# Patient Record
Sex: Male | Born: 1960 | Race: White | Hispanic: No | Marital: Married
Health system: Midwestern US, Academic
[De-identification: ages and names within clinical notes are randomized; demographics above are authoritative.]

## PROBLEM LIST (undated history)

## (undated) DIAGNOSIS — K4090 Unilateral inguinal hernia, without obstruction or gangrene, not specified as recurrent: Secondary | ICD-10-CM

---

## 2016-12-07 ENCOUNTER — Ambulatory Visit: Admit: 2016-12-07 | Discharge: 2016-12-07 | Payer: PRIVATE HEALTH INSURANCE

## 2016-12-07 DIAGNOSIS — R29898 Other symptoms and signs involving the musculoskeletal system: Secondary | ICD-10-CM

## 2016-12-07 NOTE — Unmapped (Signed)
[facility]  Department of Neurology and Rehabilitation  [address] [place], [address]  [place], [place] [address]  [phone]   Fax [phone]  Diplomate, American Board of Neuromuscular and Electrodiagnostic Medicine  Diplomate, American Board of Physical Medicine and Rehabilitation    Test Date:  12/07/2016    Patient: Craig Hughes DOB: Jul 08, 1961 Physician: Recardo Evangelist. Arabel Barcenas, MD   Sex: Male Height: '  Ref Phys: Bradly Chris, DO   ID#: [id number] Weight:  lbs. Technician: Recardo Evangelist. Ercia Crisafulli, MD     Patient Complaints:  Craig Hughes is seen at the request of Dr. Lou Miner for weakness and stiffness in all 4 extremities. He denies any neck pain or difficulty with gait. He noticed that his thighs were weak 1 1/2 years ago when trying to bring his feet up while boarding an ultralight.   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  No reports of numbness, paresthesias, or electrical sensation in the extremities.  No falls, or difficulties rising from chair or floor.  No bowel or bladder changes.      Patient History / Exam:  Normal upper and lower extremity muscle bulk and tone; no signs of atrophy or fasciculations. Sensation is normal in the bilateral upper and lower extremities. Normal strength at the shoulder and hip girdles.    Findings:   Monopolar needle EMG was performed in selected left upper and left lower extremity muscles innervated C5-T1 and L2-S1 nerve roots inclusive.  No spontaneous activity was seen in any muscles tested in the form of fibrillations, positive sharp waves, or fasciculations.  Voluntary motor unit morphologies are otherwise normal.    Motor and sensory nerve conduction studies revealed normal distal latencies, amplitudes and conduction velocities for median and ulnar nerves on the left.    Motor nerve and conduction studies revealed normal distal latencies and conduction velocities of tibial and peroneal nerves.  Sensory nerve  conduction studies revealed normal peak latencies and amplitudes for the superficial peroneal and sural nerves.          IMPRESSIONS:   1.  There are no significant electrodiagnostic findings suggesting the presence of a focal or generalized neuropathy affecting the left upper extremity and the left lower extremity.  There is no electrodiagnostic evidence of generalized peripheral neuropathy or myopathic findings.      ___________________________  Recardo Evangelist. Gracelin Weisberg, MD        Nerve Conduction Studies  Anti Sensory Summary Table     Site NR Peak (ms) Norm Peak (ms) P-T Amp (??V) Norm P-T Amp Site1 Site2 Delta-P (ms) Dist (cm) Vel (m/s) Norm Vel (m/s)   Left Radial Anti Sensory (Base 1st Digit)  30.7??C   Wrist    2.2 <3.1 28.2  Wrist Base 1st Digit 2.2 0.0     Left Sup Peron Anti Sensory (Ant Lat Mall)  31.1??C   14 cm    3.3 <4.4 11.8 >5.0 14 cm Ant Lat Mall 3.3 14.0 42 >32   Left Sural Anti Sensory (Lat Mall)  31??C   Calf    2.9 <4.0 28.2 >5.0 Calf Lat Mall 2.9 14.0 48 >35     Ortho Sensory Summary Table     Site NR Peak (ms) Norm Peak (ms) P-T Amp (??V) Norm P-T Amp Site1 Site2 Delta-P (ms) Dist (cm) Vel (m/s) Norm Vel (m/s)   Left Palmar Ortho Sensory (Wrist)  31??C   Median Palm    1.8 2.4  36.6  Median Palm Wrist 1.8 8.0 44    Ulnar Palm    1.5 2.4 40.7  Ulnar Palm Wrist 1.5 8.0 53          Median Palm Ulnar Palm 0.3 0.0       Motor Summary Table     Site NR Onset (ms) Norm Onset (ms) O-P Amp (mV) Norm O-P Amp Site1 Site2 Delta-0 (ms) Dist (cm) Vel (m/s) Norm Vel (m/s)   Left Median Motor (Abd Poll Brev)  30.4??C   Wrist    3.7 <4.2 8.3 >5 Elbow Wrist 4.0 24.0 60 >50   Elbow    7.7  8.4          Left Peroneal Motor (Ext Dig Brev)  30.8??C   Ankle    5.2 <6.1 2.0 >2.5 B Fib Ankle 5.3 32.0 60 >38   B Fib    10.5  2.2  Poplt B Fib 2.0 10.0 50 >40   Poplt    12.5  2.2          Left Tibial Motor (Abd Hall Brev)  31.1??C   Ankle    3.9 <6.1 10.6 >3.0 Knee Ankle 8.2 41.0 50 >38   Knee    12.1  10.2              5.4  1.5           Left Ulnar Motor (Abd Dig Minimi)  30.8??C   Wrist    2.9 <4.2 9.7 >3 B Elbow Wrist 3.6 25.0 69 >49   B Elbow    6.5  9.1  A Elbow B Elbow 1.5 10.0 67 >49   A Elbow    8.0  10.5            EMG     Side Muscle Nerve Root Ins Act Fibs Psw Amp Dur Poly Recrt Int Dennie Bible Comment   Left Deltoid Axillary C5-6 Nml Nml Nml Nml Nml 0 Nml Nml    Left Biceps Musculocut C5-6 Nml Nml Nml Nml Nml 0 Nml Nml    Left Triceps Radial C6-7-8 Nml Nml Nml Nml Nml 0 Nml Nml    Left PronatorTeres Median C6-7 Nml Nml Nml Nml Nml 0 Nml Nml    Left BrachioRad Radial C5-6 Nml Nml Nml Nml Nml 0 Nml Nml    Left AntTibialis Dp Br Peron L4-5 Nml Nml Nml Nml Nml 0 Nml Nml    Left Peroneus Long Sup Br Peron L5-S1 Nml Nml Nml Nml Nml 0 Nml Nml    Left MedGastroc Tibial S1-2 Nml Nml Nml Nml Nml 0 Nml Nml    Left VastusMed Femoral L2-4 Nml Nml Nml Nml Nml 0 Nml Nml    Left RectFemoris Femoral L2-4 Nml Nml Nml Nml Nml 0 Nml Nml    Left Iliopsoas Femoral L2-3 Nml Nml Nml Nml Nml 0 Nml Nml        Nerve Conduction Studies  Anti Sensory Left/Right Comparison     Site L Lat (ms) R Lat (ms) L-R Lat (ms) L Amp (??V) R Amp (??V) L-R Amp (%) Site1 Site2 L Vel (m/s) R Vel (m/s) L-R Vel (m/s)   Radial Anti Sensory (Base 1st Digit)  30.7??C   Wrist 2.2   28.2   Wrist Base 1st Digit      Sup Peron Anti Sensory (Ant Lat Mall)  31.1??C   14 cm 3.3   11.8   14 cm Ant Lat Mall 42  Sural Anti Sensory (Lat Mall)  31??C   Calf 2.9   28.2   Calf Lat Mall 48       Ortho Sensory Left/Right Comparison     Site L Lat (ms) R Lat (ms) L-R Lat (ms) L Amp (??V) R Amp (??V) L-R Amp (%) Site1 Site2 L Vel (m/s) R Vel (m/s) L-R Vel (m/s)   Palmar Ortho Sensory (Wrist)  31??C   Median Palm 1.8   36.6   Median Palm Wrist 44     Ulnar Palm 1.5   40.7   Ulnar Palm Wrist 53            Median Palm Ulnar Palm        Motor Left/Right Comparison     Site L Lat (ms) R Lat (ms) L-R Lat (ms) L Amp (mV) R Amp (mV) L-R Amp (%) Site1 Site2 L Vel (m/s) R Vel (m/s) L-R Vel (m/s)   Median Motor (Abd Poll  Brev)  30.4??C   Wrist 3.7   8.3   Elbow Wrist 60     Elbow 7.7   8.4          Peroneal Motor (Ext Dig Brev)  30.8??C   Ankle 5.2   2.0   B Fib Ankle 60     B Fib 10.5   2.2   Poplt B Fib 50     Poplt 12.5   2.2          Tibial Motor (Abd Hall Brev)  31.1??C   Ankle 3.9   10.6   Knee Ankle 50     Knee 12.1   10.2           5.4   1.5          Ulnar Motor (Abd Dig Minimi)  30.8??C   Wrist 2.9   9.7   B Elbow Wrist 69     B Elbow 6.5   9.1   A Elbow B Elbow 67     A Elbow 8.0   10.5                Waveforms:

## 2018-02-28 ENCOUNTER — Emergency Department (HOSPITAL_COMMUNITY)
Admission: EM | Admit: 2018-02-28 | Discharge: 2018-03-01 | Disposition: A | Discharge status: Home / Self Care | Payer: PRIVATE HEALTH INSURANCE | Attending: Emergency Medicine | Admitting: Emergency Medicine

## 2018-02-28 ENCOUNTER — Other Ambulatory Visit: Payer: Self-pay

## 2018-02-28 ENCOUNTER — Encounter (HOSPITAL_COMMUNITY): Payer: Self-pay | Admitting: Emergency Medicine

## 2018-02-28 DIAGNOSIS — R109 Unspecified abdominal pain: Secondary | ICD-10-CM | POA: Diagnosis present

## 2018-02-28 DIAGNOSIS — N201 Calculus of ureter: Secondary | ICD-10-CM | POA: Diagnosis not present

## 2018-02-28 HISTORY — DX: Unilateral inguinal hernia, without obstruction or gangrene, not specified as recurrent: K40.90

## 2018-02-28 LAB — COMPREHENSIVE METABOLIC PANEL
ALK PHOS: 35 U/L — AB (ref 38–126)
ALT: 24 U/L (ref 0–44)
ANION GAP: 14 (ref 5–15)
AST: 29 U/L (ref 15–41)
Albumin: 4.6 g/dL (ref 3.5–5.0)
BUN: 28 mg/dL — ABNORMAL HIGH (ref 6–20)
CALCIUM: 9.5 mg/dL (ref 8.9–10.3)
CO2: 20 mmol/L — AB (ref 22–32)
Chloride: 104 mmol/L (ref 98–111)
Creatinine, Ser: 1.47 mg/dL — ABNORMAL HIGH (ref 0.61–1.24)
GFR calc non Af Amer: 50 mL/min — ABNORMAL LOW (ref >60)
GFR, EST AFRICAN AMERICAN: 59 mL/min — AB (ref >60)
Glucose, Bld: 162 mg/dL — ABNORMAL HIGH (ref 70–99)
Potassium: 3.7 mmol/L (ref 3.5–5.1)
SODIUM: 138 mmol/L (ref 135–145)
Total Bilirubin: 1 mg/dL (ref 0.3–1.2)
Total Protein: 7.2 g/dL (ref 6.5–8.1)

## 2018-02-28 LAB — CBC
HCT: 46 % (ref 39.0–52.0)
HEMOGLOBIN: 16 g/dL (ref 13.0–17.0)
MCH: 33.5 pg (ref 26.0–34.0)
MCHC: 34.8 g/dL (ref 30.0–36.0)
MCV: 96.2 fL (ref 78.0–100.0)
Platelets: 249 10*3/uL (ref 150–400)
RBC: 4.78 MIL/uL (ref 4.22–5.81)
RDW: 11.9 % (ref 11.5–15.5)
WBC: 12.8 10*3/uL — ABNORMAL HIGH (ref 4.0–10.5)

## 2018-02-28 LAB — URINALYSIS, ROUTINE W REFLEX MICROSCOPIC
Bilirubin Urine: NEGATIVE
Glucose, UA: NEGATIVE mg/dL
KETONES UR: 80 mg/dL — AB
Leukocytes, UA: NEGATIVE
NITRITE: NEGATIVE
PH: 7 (ref 5.0–8.0)
Protein, ur: 100 mg/dL — AB
Specific Gravity, Urine: 1.024 (ref 1.005–1.030)

## 2018-02-28 LAB — LIPASE, BLOOD: LIPASE: 32 U/L (ref 11–51)

## 2018-02-28 MED ORDER — ONDANSETRON 4 MG PO TBDP
4.0000 mg | ORAL_TABLET | Freq: Once | ORAL | Status: AC | PRN
  Administered 2018-02-28: 4 mg via ORAL
  Filled 2018-02-28: qty 1

## 2018-02-28 MED ORDER — OXYCODONE-ACETAMINOPHEN 5-325 MG PO TABS
1.0000 | ORAL_TABLET | Freq: Once | ORAL | Status: AC
  Administered 2018-02-28: 1 via ORAL
  Filled 2018-02-28: qty 1

## 2018-02-28 NOTE — ED Triage Notes (Signed)
Pt reports that earlier today pt was sitting at work and started having right flank pain, then reports that the pain traveled to his pelvic region, and has since not been able to urinate more than a few drops since 5pm. Pt ambulatory and vomiting in triage.

## 2018-02-28 NOTE — ED Provider Notes (Signed)
Patient placed in Quick Look pathway, seen and evaluated   Chief Complaint: Right flank and lower quadrant pain.  HPI:   Patient states his pain began acutely at 6:00 tonight.  He had severe right flank pain.  Since then, it has traveled down his right lower quadrant.  He states he thought he was constipated and needed to urinate, was unable to have a bowel movement or pass any urine.  He denies history of similar.  He denies history of kidney stones.  He denies fevers or chills.  He has vomited and still feels nauseous.  He noted some mild dysuria last night, but no other urinary symptoms including hematuria or urinary frequency.  He states that when getting a urine sample in the ER, he had to strain and this caused worsen pain.  ROS: R flank and RLQ pain  Physical Exam:   Gen: No distress  Neuro: Awake and Alert  Skin: Warm    Focused Exam: Tenderness palpation of right flank and right lower quadrant.   Initiation of care has begun. The patient has been counseled on the process, plan, and necessity for staying for the completion/evaluation, and the remainder of the medical screening examination    Alveria ApleyCaccavale, Kaytelyn Glore, PA-C 02/28/18 2223    Linwood DibblesKnapp, Jon, MD 02/28/18 2311

## 2018-03-01 ENCOUNTER — Emergency Department (HOSPITAL_COMMUNITY): Payer: PRIVATE HEALTH INSURANCE

## 2018-03-01 MED ORDER — KETOROLAC TROMETHAMINE 30 MG/ML IJ SOLN
30.0000 mg | Freq: Once | INTRAMUSCULAR | Status: AC
  Administered 2018-03-01: 30 mg via INTRAMUSCULAR
  Filled 2018-03-01: qty 1

## 2018-03-01 NOTE — Discharge Instructions (Addendum)
CT ABDOMEN AND PELVIS WITHOUT CONTRAST     TECHNIQUE:  Multidetector CT imaging of the abdomen and pelvis was performed  following the standard protocol without IV contrast.     COMPARISON:  None.     FINDINGS:  Lower chest: No acute abnormality.     Hepatobiliary: No focal liver abnormality is seen. No gallstones,  gallbladder wall thickening, or biliary dilatation.     Pancreas: Unremarkable. No pancreatic ductal dilatation or  surrounding inflammatory changes.     Spleen: Normal in size without focal abnormality.     Adrenals/Urinary Tract: Normal adrenal glands, left kidney, left  ureter, and bladder. Punctate right kidney upper pole nonobstructing  stone. Mild right hydronephrosis with 3 mm stone at the  ureterovesicular junction.     Stomach/Bowel: Stomach is within normal limits. Appendix appears  normal. No evidence of bowel wall thickening, distention, or  inflammatory changes.     Vascular/Lymphatic: Aortic atherosclerosis. No enlarged abdominal or  pelvic lymph nodes.     Reproductive: Small prostatic calcifications.     Other: No abdominal wall hernia or abnormality. No abdominopelvic  ascites.     Musculoskeletal: Mild lumbar levocurvature with apex at L1-2. no  acute osseous abnormality identified.     IMPRESSION:  Mild right hydronephrosis with 3 mm stone at the right  ureterovesicular junction. Punctate right kidney nonobstructing  stone.

## 2018-03-01 NOTE — ED Notes (Signed)
MD to bedside for exam.

## 2018-03-01 NOTE — ED Provider Notes (Signed)
[facility] EMERGENCY DEPARTMENT Provider Note  CSN: [id number] Arrival date & time: 02/28/18 2050  Chief Complaint(s) Flank Pain and Emesis  HPI Willie Burnett is a 57 y.o. male with no pertinent past medical history presents to the emergency department with 2 days of right flank pain that was gradually worsening since onset and became severe last night around 6 PM.  Pain radiates to the right groin.  Described as aching/cramping.  Fluctuating in nature.  Associated with mild dysuria and change in urine color.  No recent fevers or chills.  Endorses nausea and nonbloody nonbilious emesis due to the pain.  No diarrhea.  He denies any prior history of renal stones.  HPI  Past Medical History Past Medical History:  Diagnosis Date  . IH (inguinal hernia)    There are no active problems to display for this patient.  Home Medication(s) Prior to Admission medications   Not on File                                                                                                                                    Past Surgical History History reviewed. No pertinent surgical history. Family History No family history on file.  Social History Social History   Tobacco Use  . Smoking status: Never Smoker  . Smokeless tobacco: Never Used  Substance Use Topics  . Alcohol use: Not Currently  . Drug use: Never   Allergies Patient has no known allergies.  Review of Systems Review of Systems All other systems are reviewed and are negative for acute change except as noted in the HPI  Physical Exam Vital Signs  I have reviewed the triage vital signs BP 127/78 (BP Location: Left Arm)   Pulse 63   Temp 98.2 F (36.8 C) (Oral)   Resp 16   Ht 5\' 8"  (1.727 m)   Wt 79.4 kg (175 lb)   SpO2 99%   BMI 26.61 kg/m   Physical Exam  Constitutional: He is oriented to person, place, and time. He appears well-developed and well-nourished. No distress.  HENT:  Head:  Normocephalic and atraumatic.  Right Ear: External ear normal.  Left Ear: External ear normal.  Nose: Nose normal.  Mouth/Throat: Mucous membranes are normal. No trismus in the jaw.  Eyes: Conjunctivae and EOM are normal. No scleral icterus.  Neck: Normal range of motion and phonation normal.  Cardiovascular: Normal rate and regular rhythm.  Pulmonary/Chest: Effort normal. No stridor. No respiratory distress.  Abdominal: He exhibits no distension. There is tenderness in the right lower quadrant. There is no rigidity, no rebound, no guarding and no tenderness at McBurney's point. No hernia.  Musculoskeletal: Normal range of motion. He exhibits no edema.  Neurological: He is alert and oriented to person, place, and time.  Skin: He is not diaphoretic.  Psychiatric: He has a normal mood and affect. His behavior is normal.  Vitals reviewed.  ED Results and Treatments Labs (all labs ordered are listed, but only abnormal results are displayed) Labs Reviewed  COMPREHENSIVE METABOLIC PANEL - Abnormal; Notable for the following components:      Result Value   CO2 20 (*)    Glucose, Bld 162 (*)    BUN 28 (*)    Creatinine, Ser 1.47 (*)    Alkaline Phosphatase 35 (*)    GFR calc non Af Amer 50 (*)    GFR calc Af Amer 59 (*)    All other components within normal limits  CBC - Abnormal; Notable for the following components:   WBC 12.8 (*)    All other components within normal limits  URINALYSIS, ROUTINE W REFLEX MICROSCOPIC - Abnormal; Notable for the following components:   Hgb urine dipstick MODERATE (*)    Ketones, ur 80 (*)    Protein, ur 100 (*)    Bacteria, UA RARE (*)    All other components within normal limits  LIPASE, BLOOD                                                                                                                         EKG  EKG Interpretation  Date/Time:    Ventricular Rate:    PR Interval:    QRS Duration:   QT Interval:    QTC Calculation:   R  Axis:     Text Interpretation:        Radiology Ct Renal Stone Study  Result Date: 03/01/2018 CLINICAL DATA:  57 y/o  M; severe right-sided flank pain. EXAM: CT ABDOMEN AND PELVIS WITHOUT CONTRAST TECHNIQUE: Multidetector CT imaging of the abdomen and pelvis was performed following the standard protocol without IV contrast. COMPARISON:  None. FINDINGS: Lower chest: No acute abnormality. Hepatobiliary: No focal liver abnormality is seen. No gallstones, gallbladder wall thickening, or biliary dilatation. Pancreas: Unremarkable. No pancreatic ductal dilatation or surrounding inflammatory changes. Spleen: Normal in size without focal abnormality. Adrenals/Urinary Tract: Normal adrenal glands, left kidney, left ureter, and bladder. Punctate right kidney upper pole nonobstructing stone. Mild right hydronephrosis with 3 mm stone at the ureterovesicular junction. Stomach/Bowel: Stomach is within normal limits. Appendix appears normal. No evidence of bowel wall thickening, distention, or inflammatory changes. Vascular/Lymphatic: Aortic atherosclerosis. No enlarged abdominal or pelvic lymph nodes. Reproductive: Small prostatic calcifications. Other: No abdominal wall hernia or abnormality. No abdominopelvic ascites. Musculoskeletal: Mild lumbar levocurvature with apex at L1-2. no acute osseous abnormality identified. IMPRESSION: Mild right hydronephrosis with 3 mm stone at the right ureterovesicular junction. Punctate right kidney nonobstructing stone. Electronically Signed   By: Mitzi HansenLance  Furusawa-Stratton M.D.   On: 03/01/2018 01:08   Pertinent labs & imaging results that were available during my care of the patient were reviewed by me and considered in my medical decision making (see chart for details).  Medications Ordered in ED Medications  ketorolac (TORADOL) 30 MG/ML injection 30 mg (has no administration in time range)  ondansetron (ZOFRAN-ODT) disintegrating tablet 4  mg (4 mg Oral Given 02/28/18 2226)    oxyCODONE-acetaminophen (PERCOCET/ROXICET) 5-325 MG per tablet 1 tablet (1 tablet Oral Given 02/28/18 2226)                                                                                                                                    Procedures Procedures  (including critical care time)  Medical Decision Making / ED Course I have reviewed the nursing notes for this encounter and the patient's prior records (if available in EHR or on provided paperwork).    Patient seen in the first look process where appropriate labs and imaging were obtained revealing a 3 mm right UVJ stone with mild hydronephrosis.  Labs with leukocytosis likely demargination from pain.  Mild renal insufficiency.  Otherwise no significant electrolyte derangements.  UA with hematuria but without evidence of urinary tract infection.  Patient treated with antiemetics and oral pain medicine resulting and improved pain.  Additionally he was given IM Toradol for additional pain control.  Patient is already on Flomax.  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  The patient appears reasonably screened and/or stabilized for discharge and I doubt any other medical condition or other [facility] requiring further screening, evaluation, or treatment in the ED at this time prior to discharge.  The patient is safe for discharge with strict return precautions.   Final Clinical Impression(s) / ED Diagnoses Final diagnoses:  Ureterolithiasis   Disposition: Discharge  Condition: Good  I have discussed the results, Dx and Tx plan with the patient who expressed understanding and agree(s) with the plan. Discharge instructions discussed at great length. The patient was given strict return precautions who verbalized understanding of the instructions. No further questions at time of discharge.    ED Discharge Orders    None       Follow Up: Urology  Call  in 5-7 days, For close follow up to assess for renal  stones      This chart was dictated using voice recognition software.  Despite best efforts to proofread,  errors can occur which can change the documentation meaning.   Nira Conn, MD 03/01/18 [phone]

## 2018-03-01 NOTE — ED Notes (Signed)
Patient to restroom.

## 2019-02-24 IMAGING — CT CT RENAL STONE PROTOCOL
2 of 4 series · 17 of 46 positions shown, 19 images · non-contrast
Comparison: None.

CLINICAL DATA: 57 y/o  M; severe right-sided flank pain.

EXAM:
CT ABDOMEN AND PELVIS WITHOUT CONTRAST
TECHNIQUE: Multidetector CT imaging of the abdomen and pelvis was performed
following the standard protocol without IV contrast.

[Series 3: renal stone 5.0 · axial · 0.77mm/px · z∈[+720,+1140]mm · 14 of 92 slices shown, 16 images]
[im 4/92  soft-tissue]
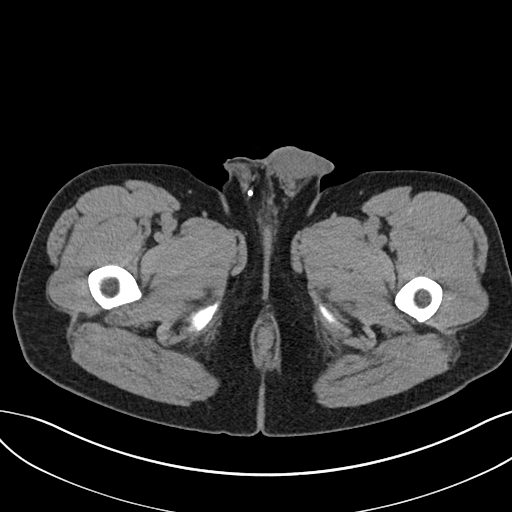
[im 4/92  bone]
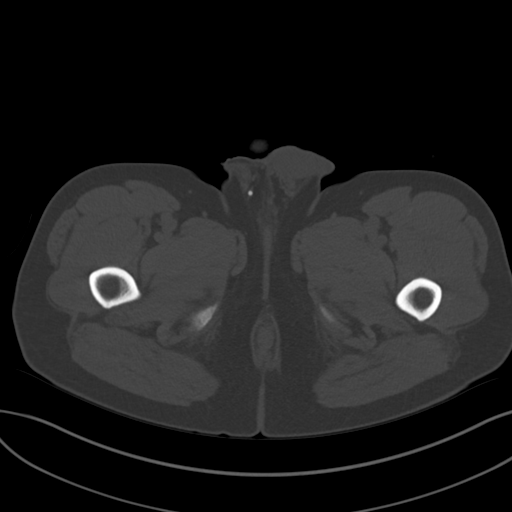
[im 12/92  soft-tissue]
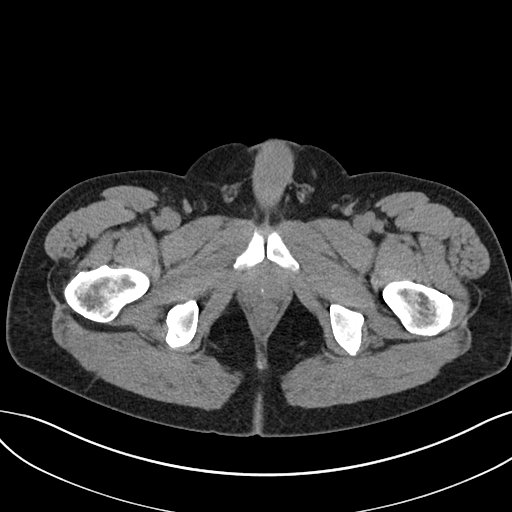
[im 19/92  soft-tissue]
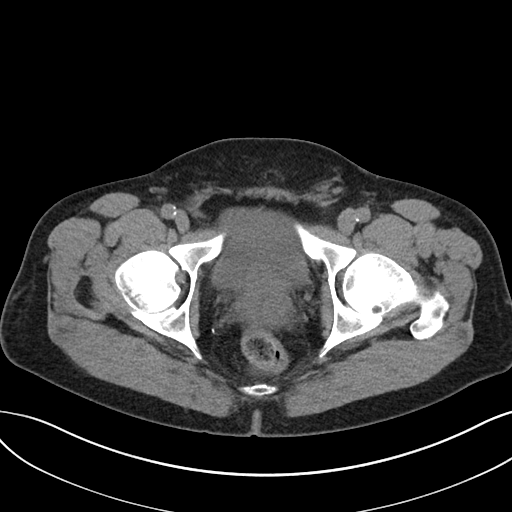
[im 23/92  soft-tissue]
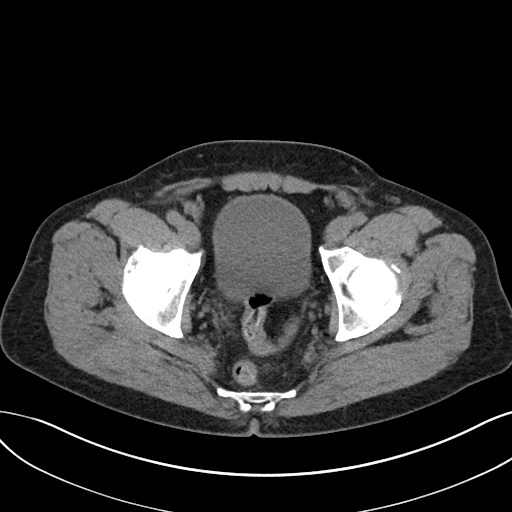
[im 31/92  soft-tissue]
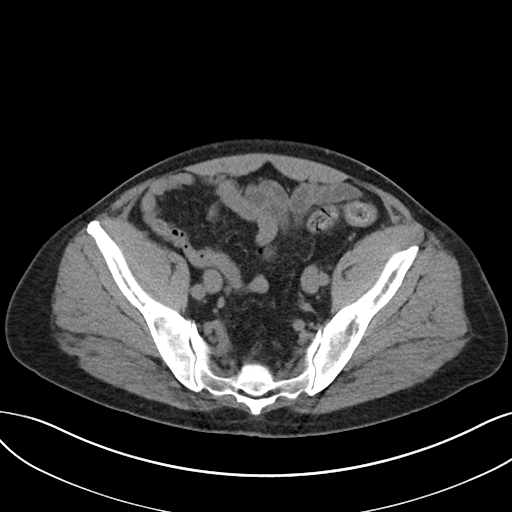
[im 38/92  soft-tissue]
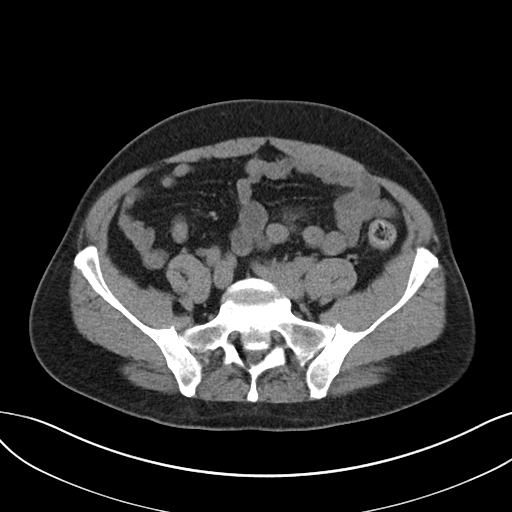
[im 42/92  soft-tissue]
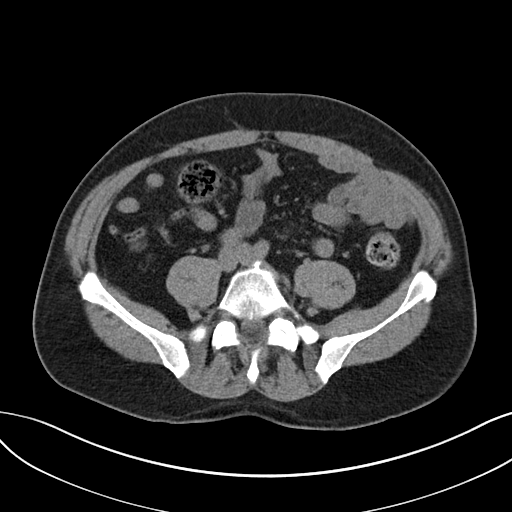
[im 50/92  soft-tissue]
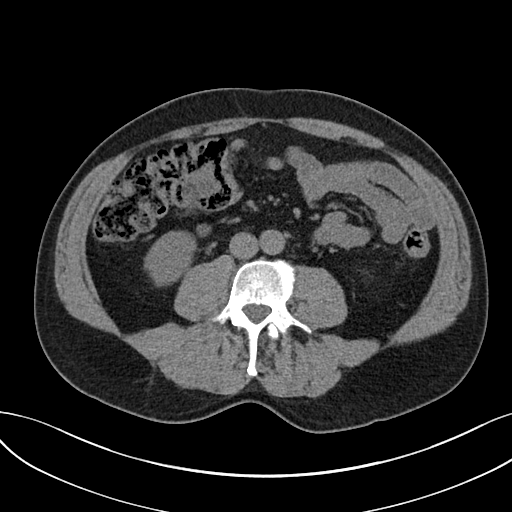
[im 54/92  soft-tissue]
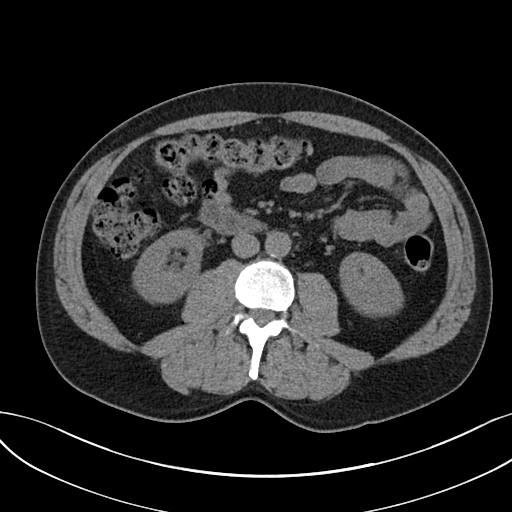
[im 54/92  bone]
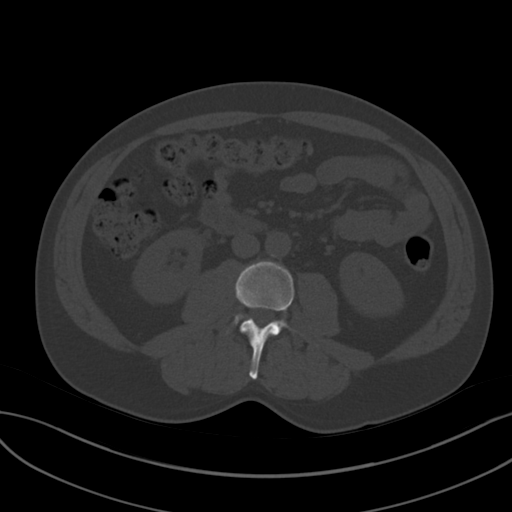
[im 61/92  soft-tissue]
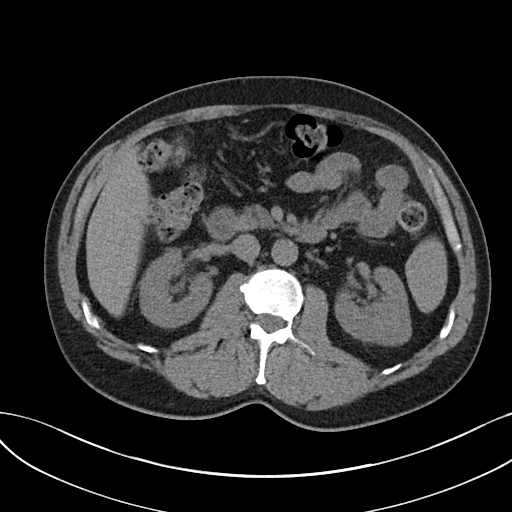
[im 69/92  soft-tissue]
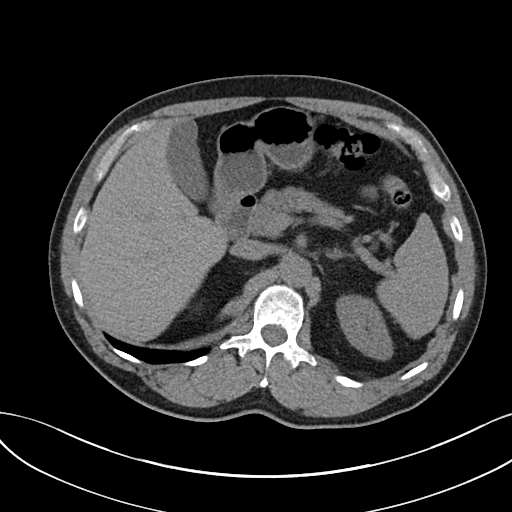
[im 73/92  soft-tissue]
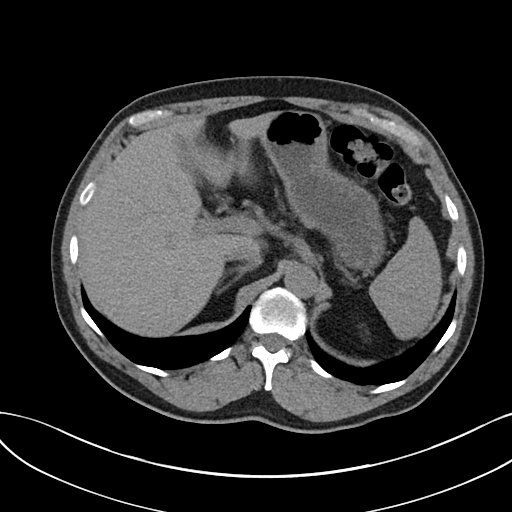
[im 80/92  soft-tissue]
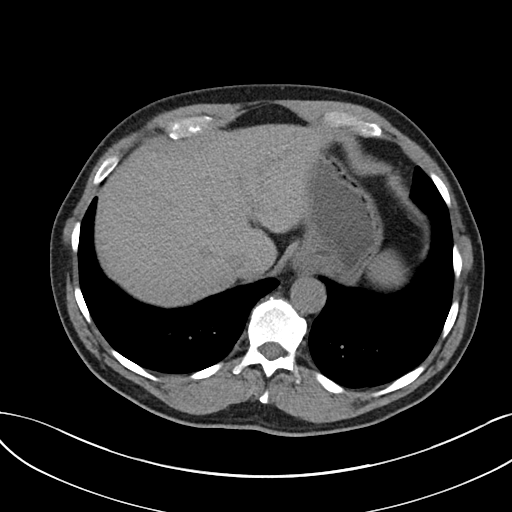
[im 88/92  soft-tissue]
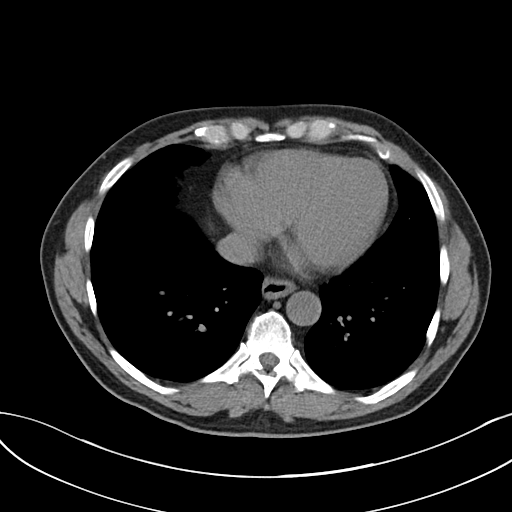

[Series 5: renal stone 3.0 cor · coronal · 0.86mm/px · 3 of 101 slices shown]
[im 34/101  soft-tissue]
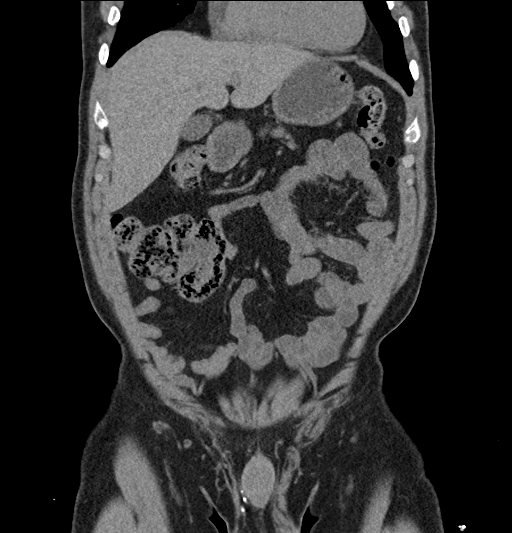
[im 45/101  soft-tissue]
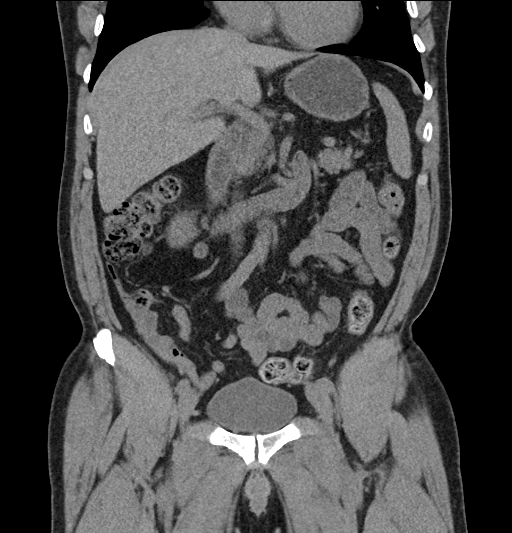
[im 56/101  soft-tissue]
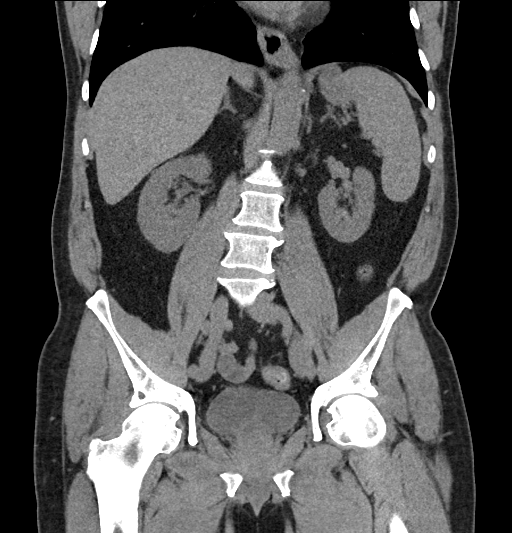

[17 of 46 positions shown; findings below may reference images not displayed]

FINDINGS: Lower chest: No acute abnormality.

Hepatobiliary: No focal liver abnormality is seen. No gallstones,
gallbladder wall thickening, or biliary dilatation.

Pancreas: Unremarkable. No pancreatic ductal dilatation or
surrounding inflammatory changes.

Spleen: Normal in size without focal abnormality.

Adrenals/Urinary Tract: Normal adrenal glands, left kidney, left
ureter, and bladder. Punctate right kidney upper pole nonobstructing
stone. Mild right hydronephrosis with 3 mm stone at the
ureterovesicular junction.

Stomach/Bowel: Stomach is within normal limits. Appendix appears
normal. No evidence of bowel wall thickening, distention, or
inflammatory changes.

Vascular/Lymphatic: Aortic atherosclerosis. No enlarged abdominal or
pelvic lymph nodes.

Reproductive: Small prostatic calcifications.

Other: No abdominal wall hernia or abnormality. No abdominopelvic
ascites.

Musculoskeletal: Mild lumbar levocurvature with apex at L1-2. no
acute osseous abnormality identified.
IMPRESSION: Mild right hydronephrosis with 3 mm stone at the right
ureterovesicular junction. Punctate right kidney nonobstructing
stone.

By: Zudo Karlsson M.D.
